# Patient Record
Sex: Female | Born: 1950 | Race: White | Hispanic: No | Marital: Married | Smoking: Never smoker
Health system: Southern US, Community
[De-identification: ages and names within clinical notes are randomized; demographics above are authoritative.]

---

## 1998-11-08 ENCOUNTER — Other Ambulatory Visit: Admission: RE | Admit: 1998-11-08 | Discharge: 1998-11-08 | Payer: Self-pay | Admitting: Obstetrics and Gynecology

## 1999-08-30 ENCOUNTER — Encounter (INDEPENDENT_AMBULATORY_CARE_PROVIDER_SITE_OTHER): Payer: Self-pay | Admitting: Specialist

## 1999-08-30 ENCOUNTER — Other Ambulatory Visit: Admission: RE | Admit: 1999-08-30 | Discharge: 1999-08-30 | Payer: Self-pay | Admitting: Internal Medicine

## 1999-09-11 ENCOUNTER — Ambulatory Visit (HOSPITAL_COMMUNITY): Admission: RE | Admit: 1999-09-11 | Discharge: 1999-09-11 | Payer: Self-pay | Admitting: Internal Medicine

## 1999-10-29 ENCOUNTER — Encounter: Payer: Self-pay | Admitting: Internal Medicine

## 1999-10-29 ENCOUNTER — Ambulatory Visit (HOSPITAL_COMMUNITY): Admission: RE | Admit: 1999-10-29 | Discharge: 1999-10-29 | Payer: Self-pay | Admitting: Internal Medicine

## 1999-11-20 ENCOUNTER — Other Ambulatory Visit: Admission: RE | Admit: 1999-11-20 | Discharge: 1999-11-20 | Payer: Self-pay | Admitting: Obstetrics and Gynecology

## 2001-12-30 ENCOUNTER — Other Ambulatory Visit: Admission: RE | Admit: 2001-12-30 | Discharge: 2001-12-30 | Payer: Self-pay | Admitting: Obstetrics & Gynecology

## 2003-04-09 ENCOUNTER — Emergency Department (HOSPITAL_COMMUNITY): Admission: EM | Admit: 2003-04-09 | Discharge: 2003-04-09 | Payer: Self-pay | Admitting: Emergency Medicine

## 2003-04-12 ENCOUNTER — Ambulatory Visit (HOSPITAL_COMMUNITY): Admission: RE | Admit: 2003-04-12 | Discharge: 2003-04-12 | Payer: Self-pay | Admitting: Emergency Medicine

## 2003-04-16 ENCOUNTER — Emergency Department (HOSPITAL_COMMUNITY): Admission: EM | Admit: 2003-04-16 | Discharge: 2003-04-16 | Payer: Self-pay | Admitting: Emergency Medicine

## 2003-04-23 ENCOUNTER — Emergency Department (HOSPITAL_COMMUNITY): Admission: EM | Admit: 2003-04-23 | Discharge: 2003-04-23 | Payer: Self-pay | Admitting: Emergency Medicine

## 2003-05-07 ENCOUNTER — Emergency Department (HOSPITAL_COMMUNITY): Admission: EM | Admit: 2003-05-07 | Discharge: 2003-05-07 | Payer: Self-pay | Admitting: Emergency Medicine

## 2004-08-03 ENCOUNTER — Ambulatory Visit: Payer: Self-pay | Admitting: Internal Medicine

## 2004-08-09 ENCOUNTER — Ambulatory Visit: Payer: Self-pay | Admitting: Internal Medicine

## 2004-09-24 ENCOUNTER — Ambulatory Visit: Payer: Self-pay | Admitting: Internal Medicine

## 2005-03-26 ENCOUNTER — Other Ambulatory Visit: Admission: RE | Admit: 2005-03-26 | Discharge: 2005-03-26 | Payer: Self-pay | Admitting: Obstetrics and Gynecology

## 2005-04-26 ENCOUNTER — Ambulatory Visit: Payer: Self-pay | Admitting: Internal Medicine

## 2005-05-08 ENCOUNTER — Ambulatory Visit: Payer: Self-pay | Admitting: Internal Medicine

## 2005-11-04 ENCOUNTER — Ambulatory Visit: Payer: Self-pay | Admitting: Internal Medicine

## 2006-01-20 ENCOUNTER — Ambulatory Visit: Payer: Self-pay | Admitting: Internal Medicine

## 2006-04-11 ENCOUNTER — Ambulatory Visit: Payer: Self-pay | Admitting: Internal Medicine

## 2006-04-11 LAB — CONVERTED CEMR LAB
ALT: 26 units/L (ref 0–40)
AST: 25 units/L (ref 0–37)
Albumin: 4.8 g/dL (ref 3.5–5.2)
Alkaline Phosphatase: 54 units/L (ref 39–117)
BUN: 8 mg/dL (ref 6–23)
Basophils Absolute: 0 10*3/uL (ref 0.0–0.1)
Basophils Relative: 0.3 % (ref 0.0–1.0)
CO2: 30 meq/L (ref 19–32)
Calcium: 9.7 mg/dL (ref 8.4–10.5)
Chloride: 104 meq/L (ref 96–112)
Chol/HDL Ratio, serum: 2
Cholesterol: 192 mg/dL (ref 0–200)
Creatinine, Ser: 0.7 mg/dL (ref 0.4–1.2)
Eosinophil percent: 1.3 % (ref 0.0–5.0)
GFR calc non Af Amer: 93 mL/min
Glomerular Filtration Rate, Af Am: 112 mL/min/{1.73_m2}
Glucose, Bld: 86 mg/dL (ref 70–99)
HCT: 39 % (ref 36.0–46.0)
HDL: 97.3 mg/dL (ref >39.0)
Hemoglobin: 13.2 g/dL (ref 12.0–15.0)
LDL Cholesterol: 86 mg/dL (ref 0–99)
Lymphocytes Relative: 20.7 % (ref 12.0–46.0)
MCHC: 33.9 g/dL (ref 30.0–36.0)
MCV: 97.5 fL (ref 78.0–100.0)
Monocytes Absolute: 0.4 10*3/uL (ref 0.2–0.7)
Monocytes Relative: 7.9 % (ref 3.0–11.0)
Neutro Abs: 3.5 10*3/uL (ref 1.4–7.7)
Neutrophils Relative %: 69.8 % (ref 43.0–77.0)
Platelets: 307 10*3/uL (ref 150–400)
Potassium: 4.2 meq/L (ref 3.5–5.1)
RBC: 4 M/uL (ref 3.87–5.11)
RDW: 13.8 % (ref 11.5–14.6)
Sodium: 143 meq/L (ref 135–145)
TSH: 1.26 microintl units/mL (ref 0.35–5.50)
Total Bilirubin: 0.9 mg/dL (ref 0.3–1.2)
Total Protein: 7.2 g/dL (ref 6.0–8.3)
Triglyceride fasting, serum: 45 mg/dL (ref 0–149)
VLDL: 9 mg/dL (ref 0–40)
WBC: 5 10*3/uL (ref 4.5–10.5)

## 2006-04-18 ENCOUNTER — Ambulatory Visit: Payer: Self-pay | Admitting: Internal Medicine

## 2006-09-04 ENCOUNTER — Ambulatory Visit: Payer: Self-pay | Admitting: Internal Medicine

## 2007-08-13 ENCOUNTER — Telehealth: Payer: Self-pay | Admitting: Internal Medicine

## 2007-08-14 ENCOUNTER — Ambulatory Visit: Payer: Self-pay | Admitting: Internal Medicine

## 2007-08-14 DIAGNOSIS — I10 Essential (primary) hypertension: Secondary | ICD-10-CM | POA: Insufficient documentation

## 2007-08-14 DIAGNOSIS — M1990 Unspecified osteoarthritis, unspecified site: Secondary | ICD-10-CM | POA: Insufficient documentation

## 2007-08-14 DIAGNOSIS — E785 Hyperlipidemia, unspecified: Secondary | ICD-10-CM | POA: Insufficient documentation

## 2007-09-04 ENCOUNTER — Ambulatory Visit: Payer: Self-pay | Admitting: Internal Medicine

## 2007-09-04 LAB — CONVERTED CEMR LAB
ALT: 34 units/L (ref 0–35)
AST: 26 units/L (ref 0–37)
Albumin: 4.5 g/dL (ref 3.5–5.2)
Alkaline Phosphatase: 60 units/L (ref 39–117)
BUN: 13 mg/dL (ref 6–23)
Basophils Absolute: 0 10*3/uL (ref 0.0–0.1)
Basophils Relative: 0.1 % (ref 0.0–1.0)
Bilirubin Urine: NEGATIVE
Bilirubin, Direct: 0.1 mg/dL (ref 0.0–0.3)
Blood in Urine, dipstick: NEGATIVE
CO2: 30 meq/L (ref 19–32)
Calcium: 9.7 mg/dL (ref 8.4–10.5)
Chloride: 105 meq/L (ref 96–112)
Cholesterol: 175 mg/dL (ref 0–200)
Creatinine, Ser: 0.7 mg/dL (ref 0.4–1.2)
Eosinophils Absolute: 0.1 10*3/uL (ref 0.0–0.7)
Eosinophils Relative: 2 % (ref 0.0–5.0)
GFR calc Af Amer: 112 mL/min
GFR calc non Af Amer: 92 mL/min
Glucose, Bld: 86 mg/dL (ref 70–99)
Glucose, Urine, Semiquant: NEGATIVE
HCT: 40.1 % (ref 36.0–46.0)
HDL: 82.5 mg/dL (ref >39.0)
Hemoglobin: 13.6 g/dL (ref 12.0–15.0)
Ketones, urine, test strip: NEGATIVE
LDL Cholesterol: 78 mg/dL (ref 0–99)
Lymphocytes Relative: 19.2 % (ref 12.0–46.0)
MCHC: 33.9 g/dL (ref 30.0–36.0)
MCV: 97.4 fL (ref 78.0–100.0)
Monocytes Absolute: 0.3 10*3/uL (ref 0.1–1.0)
Monocytes Relative: 5.8 % (ref 3.0–12.0)
Neutro Abs: 4 10*3/uL (ref 1.4–7.7)
Neutrophils Relative %: 72.9 % (ref 43.0–77.0)
Nitrite: NEGATIVE
Platelets: 272 10*3/uL (ref 150–400)
Potassium: 4.6 meq/L (ref 3.5–5.1)
Protein, U semiquant: NEGATIVE
RBC: 4.11 M/uL (ref 3.87–5.11)
RDW: 12.9 % (ref 11.5–14.6)
Sodium: 144 meq/L (ref 135–145)
Specific Gravity, Urine: <1.005
TSH: 1.37 microintl units/mL (ref 0.35–5.50)
Total Bilirubin: 0.7 mg/dL (ref 0.3–1.2)
Total CHOL/HDL Ratio: 2.1
Total Protein: 7.6 g/dL (ref 6.0–8.3)
Triglycerides: 73 mg/dL (ref 0–149)
Urobilinogen, UA: 0.2
VLDL: 15 mg/dL (ref 0–40)
WBC Urine, dipstick: NEGATIVE
WBC: 5.5 10*3/uL (ref 4.5–10.5)
pH: 5.5

## 2007-09-11 ENCOUNTER — Ambulatory Visit: Payer: Self-pay | Admitting: Internal Medicine

## 2007-09-16 ENCOUNTER — Encounter: Payer: Self-pay | Admitting: Internal Medicine

## 2007-09-16 ENCOUNTER — Ambulatory Visit: Payer: Self-pay | Admitting: Internal Medicine

## 2008-01-09 LAB — CONVERTED CEMR LAB: Pap Smear: NORMAL

## 2008-02-18 ENCOUNTER — Ambulatory Visit: Payer: Self-pay | Admitting: Internal Medicine

## 2008-10-26 ENCOUNTER — Ambulatory Visit: Payer: Self-pay | Admitting: Internal Medicine

## 2008-10-26 LAB — CONVERTED CEMR LAB
ALT: 43 units/L — ABNORMAL HIGH (ref 0–35)
AST: 37 units/L (ref 0–37)
Albumin: 4.6 g/dL (ref 3.5–5.2)
Alkaline Phosphatase: 57 units/L (ref 39–117)
BUN: 9 mg/dL (ref 6–23)
Basophils Absolute: 0 10*3/uL (ref 0.0–0.1)
Basophils Relative: 0.2 % (ref 0.0–3.0)
Bilirubin Urine: NEGATIVE
Bilirubin, Direct: 0 mg/dL (ref 0.0–0.3)
Blood in Urine, dipstick: NEGATIVE
CO2: 30 meq/L (ref 19–32)
Calcium: 9.4 mg/dL (ref 8.4–10.5)
Chloride: 97 meq/L (ref 96–112)
Cholesterol: 165 mg/dL (ref 0–200)
Creatinine, Ser: 0.6 mg/dL (ref 0.4–1.2)
Eosinophils Absolute: 0.1 10*3/uL (ref 0.0–0.7)
Eosinophils Relative: 1.5 % (ref 0.0–5.0)
GFR calc non Af Amer: 109.55 mL/min (ref >60)
Glucose, Bld: 94 mg/dL (ref 70–99)
Glucose, Urine, Semiquant: NEGATIVE
HCT: 37.5 % (ref 36.0–46.0)
HDL: 94.1 mg/dL (ref >39.00)
Hemoglobin: 13.1 g/dL (ref 12.0–15.0)
LDL Cholesterol: 60 mg/dL (ref 0–99)
Lymphocytes Relative: 23 % (ref 12.0–46.0)
Lymphs Abs: 1.1 10*3/uL (ref 0.7–4.0)
MCHC: 34.8 g/dL (ref 30.0–36.0)
MCV: 97.7 fL (ref 78.0–100.0)
Monocytes Absolute: 0.3 10*3/uL (ref 0.1–1.0)
Monocytes Relative: 7.2 % (ref 3.0–12.0)
Neutro Abs: 3.2 10*3/uL (ref 1.4–7.7)
Neutrophils Relative %: 68.1 % (ref 43.0–77.0)
Nitrite: NEGATIVE
Platelets: 255 10*3/uL (ref 150.0–400.0)
Potassium: 4.4 meq/L (ref 3.5–5.1)
Protein, U semiquant: NEGATIVE
RBC: 3.84 M/uL — ABNORMAL LOW (ref 3.87–5.11)
RDW: 12.7 % (ref 11.5–14.6)
Sodium: 141 meq/L (ref 135–145)
Specific Gravity, Urine: 1.01
TSH: 1.09 microintl units/mL (ref 0.35–5.50)
Total Bilirubin: 0.9 mg/dL (ref 0.3–1.2)
Total CHOL/HDL Ratio: 2
Total Protein: 7.9 g/dL (ref 6.0–8.3)
Triglycerides: 57 mg/dL (ref 0.0–149.0)
Urobilinogen, UA: 0.2
VLDL: 11.4 mg/dL (ref 0.0–40.0)
WBC: 4.7 10*3/uL (ref 4.5–10.5)
pH: 7

## 2008-11-02 ENCOUNTER — Ambulatory Visit: Payer: Self-pay | Admitting: Internal Medicine

## 2008-11-07 ENCOUNTER — Ambulatory Visit: Payer: Self-pay | Admitting: Internal Medicine

## 2008-11-07 DIAGNOSIS — S99919A Unspecified injury of unspecified ankle, initial encounter: Secondary | ICD-10-CM

## 2008-11-07 DIAGNOSIS — S99929A Unspecified injury of unspecified foot, initial encounter: Secondary | ICD-10-CM | POA: Insufficient documentation

## 2008-11-07 DIAGNOSIS — S8990XA Unspecified injury of unspecified lower leg, initial encounter: Secondary | ICD-10-CM

## 2008-11-16 ENCOUNTER — Ambulatory Visit: Payer: Self-pay | Admitting: Internal Medicine

## 2008-12-20 ENCOUNTER — Ambulatory Visit: Payer: Self-pay | Admitting: Internal Medicine

## 2009-01-20 ENCOUNTER — Ambulatory Visit: Payer: Self-pay | Admitting: Internal Medicine

## 2009-02-16 ENCOUNTER — Ambulatory Visit: Payer: Self-pay | Admitting: Internal Medicine

## 2009-12-14 ENCOUNTER — Ambulatory Visit: Payer: Self-pay | Admitting: Family Medicine

## 2009-12-14 ENCOUNTER — Encounter: Payer: Self-pay | Admitting: Internal Medicine

## 2010-01-19 ENCOUNTER — Ambulatory Visit: Payer: Self-pay | Admitting: Internal Medicine

## 2010-01-19 LAB — CONVERTED CEMR LAB
ALT: 34 units/L (ref 0–35)
AST: 35 units/L (ref 0–37)
Albumin: 4.2 g/dL (ref 3.5–5.2)
Alkaline Phosphatase: 63 units/L (ref 39–117)
BUN: 16 mg/dL (ref 6–23)
Basophils Absolute: 0.1 10*3/uL (ref 0.0–0.1)
Basophils Relative: 1.4 % (ref 0.0–3.0)
Bilirubin Urine: NEGATIVE
Bilirubin, Direct: 0.1 mg/dL (ref 0.0–0.3)
CO2: 29 meq/L (ref 19–32)
Calcium: 8.9 mg/dL (ref 8.4–10.5)
Chloride: 105 meq/L (ref 96–112)
Cholesterol: 209 mg/dL — ABNORMAL HIGH (ref 0–200)
Creatinine, Ser: 0.5 mg/dL (ref 0.4–1.2)
Direct LDL: 95.7 mg/dL
Eosinophils Absolute: 0.1 10*3/uL (ref 0.0–0.7)
Eosinophils Relative: 2.5 % (ref 0.0–5.0)
GFR calc non Af Amer: 134.61 mL/min (ref >60)
Glucose, Bld: 74 mg/dL (ref 70–99)
HCT: 35.5 % — ABNORMAL LOW (ref 36.0–46.0)
HDL: 95 mg/dL (ref >39.00)
Hemoglobin, Urine: NEGATIVE
Hemoglobin: 12.5 g/dL (ref 12.0–15.0)
Ketones, ur: NEGATIVE mg/dL
Lymphocytes Relative: 33.6 % (ref 12.0–46.0)
Lymphs Abs: 1.5 10*3/uL (ref 0.7–4.0)
MCHC: 35.3 g/dL (ref 30.0–36.0)
MCV: 98 fL (ref 78.0–100.0)
Monocytes Absolute: 0.4 10*3/uL (ref 0.1–1.0)
Monocytes Relative: 8 % (ref 3.0–12.0)
Neutro Abs: 2.4 10*3/uL (ref 1.4–7.7)
Neutrophils Relative %: 54.5 % (ref 43.0–77.0)
Nitrite: NEGATIVE
Platelets: 227 10*3/uL (ref 150.0–400.0)
Potassium: 4.7 meq/L (ref 3.5–5.1)
RBC: 3.62 M/uL — ABNORMAL LOW (ref 3.87–5.11)
RDW: 14.9 % — ABNORMAL HIGH (ref 11.5–14.6)
Sodium: 141 meq/L (ref 135–145)
Specific Gravity, Urine: 1.015 (ref 1.000–1.030)
TSH: 1.2 microintl units/mL (ref 0.35–5.50)
Total Bilirubin: 0.6 mg/dL (ref 0.3–1.2)
Total CHOL/HDL Ratio: 2
Total Protein, Urine: NEGATIVE mg/dL
Total Protein: 6.8 g/dL (ref 6.0–8.3)
Triglycerides: 132 mg/dL (ref 0.0–149.0)
Urine Glucose: NEGATIVE mg/dL
Urobilinogen, UA: 0.2 (ref 0.0–1.0)
VLDL: 26.4 mg/dL (ref 0.0–40.0)
WBC: 4.4 10*3/uL — ABNORMAL LOW (ref 4.5–10.5)
pH: 7 (ref 5.0–8.0)

## 2010-01-29 ENCOUNTER — Ambulatory Visit: Payer: Self-pay | Admitting: Internal Medicine

## 2010-06-12 NOTE — Miscellaneous (Signed)
Summary: BONE DENSITY  Clinical Lists Changes  Orders: Added new Test order of T-Bone Densitometry (77080) - Signed Added new Test order of T-Lumbar Vertebral Assessment (77082) - Signed 

## 2010-06-12 NOTE — Assessment & Plan Note (Signed)
Summary: cpx/no pap/njr   Vital Signs:  Patient profile:   60 year old female Height:      62 inches Weight:      150 pounds BMI:     27.53 Temp:     98.2 degrees F oral Pulse rate:   88 / minute Resp:     14 per minute BP sitting:   150 / 70  (left arm)  Vitals Entered By: Willy Eddy, LPN (January 29, 2010 3:18 PM) CC: cpx Is Patient Diabetic? No   CC:  cpx.  History of Present Illness: The pt was asked about all immunizations, health maint. services that are appropriate to their age and was given guidance on diet exercize  and weight management  fatigue with multiple contriutors: insomnia, mild anemia, awakenings new finding mild anemia with fatigue increased palntar faciatis episodes    Preventive Screening-Counseling & Management  Alcohol-Tobacco     Smoking Status: never     Passive Smoke Exposure: no     Tobacco Counseling: not indicated; no tobacco use  Problems Prior to Update: 1)  Foot Injury, Right  (ICD-959.7) 2)  Preventive Health Care  (ICD-V70.0) 3)  Osteoarthritis  (ICD-715.90) 4)  Hypertension  (ICD-401.9) 5)  Hyperlipidemia  (ICD-272.4)  Current Problems (verified): 1)  Foot Injury, Right  (ICD-959.7) 2)  Preventive Health Care  (ICD-V70.0) 3)  Osteoarthritis  (ICD-715.90) 4)  Hypertension  (ICD-401.9) 5)  Hyperlipidemia  (ICD-272.4)  Medications Prior to Update: 1)  Lipitor 10 Mg  Tabs (Atorvastatin Calcium) .... Qd 2)  Benicar 20 Mg  Tabs (Olmesartan Medoxomil) .... One By Mouth Daily 3)  Fosamax 70 Mg  Tabs (Alendronate Sodium) .Marland Kitchen.. 1 Every Week 4)  Doxycycline Hyclate 100 Mg  Caps (Doxycycline Hyclate) .Marland Kitchen.. 1 Once Daily As Needed Rosacea Flare 5)  Ambien Cr 12.5 Mg  Tbcr (Zolpidem Tartrate) .Marland Kitchen.. 1 At Bedtime As Needed 6)  Lotrisone 1-0.05 % Lotn (Clotrimazole-Betamethasone) .... 5 Drop in Affected Ear Once Daily  Current Medications (verified): 1)  Lipitor 10 Mg  Tabs (Atorvastatin Calcium) .... Qd 2)  Benicar 20 Mg  Tabs  (Olmesartan Medoxomil) .... One By Mouth Daily 3)  Fosamax 70 Mg  Tabs (Alendronate Sodium) .Marland Kitchen.. 1 Every Week 4)  Doxycycline Hyclate 100 Mg  Caps (Doxycycline Hyclate) .Marland Kitchen.. 1 Once Daily As Needed Rosacea Flare 5)  Lotrisone 1-0.05 % Lotn (Clotrimazole-Betamethasone) .... 5 Drop in Affected Ear Once Daily 6)  Folbee 2.5-25-1 Mg Tabs (Folic Acid-Vit B6-Vit B12) .... One By Mouth Daily 7)  Zolpidem Tartrate 6.25 Mg Cr-Tabs (Zolpidem Tartrate) .... One By Mouth Q Hs Prn  Allergies (verified): 1)  ! Demerol  Past History:  Family History: Last updated: 08/14/2007 mother  57 d/c Family History Hypertension father 11  d/c Family History of Prostate CA 1st degree relative <50  Social History: Last updated: 08/14/2007 Married Never Smoked Alcohol use-yes Drug use-no Regular exercise-no  Risk Factors: Exercise: no (08/14/2007)  Risk Factors: Smoking Status: never (01/29/2010) Passive Smoke Exposure: no (01/29/2010)  Past medical, surgical, family and social histories (including risk factors) reviewed, and no changes noted (except as noted below).  Past Medical History: Reviewed history from 08/14/2007 and no changes required. Hyperlipidemia Hypertension Osteoarthritis  Past Surgical History: Reviewed history from 08/14/2007 and no changes required. Caesarean section Hysterectomy  Family History: Reviewed history from 08/14/2007 and no changes required. mother  38 d/c Family History Hypertension father 42  d/c Family History of Prostate CA 1st degree relative <50  Social  History: Reviewed history from 08/14/2007 and no changes required. Married Never Smoked Alcohol use-yes Drug use-no Regular exercise-no  Review of Systems  The patient denies anorexia, fever, weight loss, weight gain, vision loss, decreased hearing, hoarseness, chest pain, syncope, dyspnea on exertion, peripheral edema, prolonged cough, headaches, hemoptysis, abdominal pain, melena, hematochezia,  severe indigestion/heartburn, hematuria, incontinence, genital sores, muscle weakness, suspicious skin lesions, transient blindness, difficulty walking, depression, unusual weight change, abnormal bleeding, enlarged lymph nodes, angioedema, and breast masses.    Contraindications/Deferment of Procedures/Staging:    Test/Procedure: FLU VAX    Reason for deferment: patient declined   Physical Exam  General:  Well-developed,well-nourished,in no acute distress; alert,appropriate and cooperative throughout examinationoverweight-appearing.   Head:  Normocephalic and atraumatic without obvious abnormalities. No apparent alopecia or balding. Eyes:  No corneal or conjunctival inflammation noted. EOMI. Perrla. Funduscopic exam benign, without hemorrhages, exudates or papilledema. Vision grossly normal. Nose:  External nasal examination shows no deformity or inflammation. Nasal mucosa are pink and moist without lesions or exudates. Neck:  No deformities, masses, or tenderness noted. Lungs:  Normal respiratory effort, chest expands symmetrically. Lungs are clear to auscultation, no crackles or wheezes. Heart:  Normal rate and regular rhythm. S1 and S2 normal without gallop, murmur, click, rub or other extra sounds. Abdomen:  no bruitssoft, non-tender, and normal bowel sounds.   Msk:  normal ROM and no joint tenderness.   Pulses:  R and L carotid,radial,femoral,dorsalis pedis and posterior tibial pulses are full and equal bilaterally Extremities:  No clubbing, cyanosis, edema, or deformity noted with normal full range of motion of all joints.   Neurologic:  No cranial nerve deficits noted. Station and gait are normal. Plantar reflexes are down-going bilaterally. DTRs are symmetrical throughout. Sensory, motor and coordinative functions appear intact.   Impression & Recommendations:  Problem # 1:  PREVENTIVE HEALTH CARE (ICD-V70.0) Assessment Unchanged The pt was asked about all immunizations, health  maint. services that are appropriate to their age and was given guidance on diet exercize  and weight management weight loss primary goal Mammogram: normal (01/09/2008) Pap smear: normal (01/09/2008) Colonoscopy: Location:  [facility].   (01/20/2009) Td Booster: Td (09/11/2002)   Flu Vax: Fluvax 3+ (02/16/2009)   Chol: 209 (01/19/2010)   HDL: 95.00 (01/19/2010)   LDL: 60 (10/26/2008)   TG: 132.0 (01/19/2010) TSH: 1.20 (01/19/2010)   Next mammogram due:: 01/2009 (11/02/2008) Next Colonoscopy due:: 01/2019 (01/20/2009)  Discussed using sunscreen, use of alcohol, drug use, self breast exam, routine dental care, routine eye care, schedule for GYN exam, routine physical exam, seat belts, multiple vitamins, osteoporosis prevention, adequate calcium intake in diet, recommendations for immunizations, mammograms and Pap smears.  Discussed exercise and checking cholesterol.  Discussed gun safety, safe sex, and contraception.  Problem # 2:  HYPERTENSION (ICD-401.9) Assessment: Unchanged weight loss Her updated medication list for this problem includes:    Benicar 20 Mg Tabs (Olmesartan medoxomil) ..... One by mouth daily  Orders: EKG w/ Interpretation (93000)  BP today: 150/70 Prior BP: 136/80 (11/02/2008)  Prior 10 Yr Risk Heart Disease: 4 % (09/11/2007)  Labs Reviewed: K+: 4.7 (01/19/2010) Creat: : 0.5 (01/19/2010)   Chol: 209 (01/19/2010)   HDL: 95.00 (01/19/2010)   LDL: 60 (10/26/2008)   TG: 132.0 (01/19/2010)  Problem # 3:  FOOT INJURY, RIGHT (ICD-959.7) stretching  Problem # 4:  HYPERLIPIDEMIA (ICD-272.4) stalble Her updated medication list for this problem includes:    Lipitor 10 Mg Tabs (Atorvastatin calcium) ..... Qd  Labs Reviewed: SGOT: 35 (  01/19/2010)   SGPT: 34 (01/19/2010)  Prior 10 Yr Risk Heart Disease: 4 % (09/11/2007)   HDL:95.00 (01/19/2010), 94.10 (10/26/2008)  LDL:60 (10/26/2008), 78 (09/04/2007)  Chol:209 (01/19/2010), 165 (10/26/2008)  Trig:132.0  (01/19/2010), 57.0 (10/26/2008)  Complete Medication List: 1)  Lipitor 10 Mg Tabs (Atorvastatin calcium) .... Qd 2)  Benicar 20 Mg Tabs (Olmesartan medoxomil) .... One by mouth daily 3)  Fosamax 70 Mg Tabs (Alendronate sodium) .Marland Kitchen.. 1 every week 4)  Doxycycline Hyclate 100 Mg Caps (Doxycycline hyclate) .Marland Kitchen.. 1 once daily as needed rosacea flare 5)  Lotrisone 1-0.05 % Lotn (Clotrimazole-betamethasone) .... 5 drop in affected ear once daily 6)  Folbee 2.5-25-1 Mg Tabs (Folic acid-vit b6-vit b12) .... One by mouth daily 7)  Zolpidem Tartrate 6.25 Mg Cr-tabs (Zolpidem tartrate) .... One by mouth q hs prn  Patient Instructions: 1)  [email or web address] for weight loss 2)  Please schedule a follow-up appointment in 6 months. Prescriptions: ZOLPIDEM TARTRATE 6.25 MG CR-TABS (ZOLPIDEM TARTRATE) one by mouth q HS prn  #30 x 3   Entered and Authorized by:   Stacie Glaze MD   Signed by:   Stacie Glaze MD on 01/29/2010   Method used:   Print then Give to Patient   RxID:   [id number] FOLBEE 2.5-25-1 MG TABS (FOLIC ACID-VIT B6-VIT B12) one by mouth daily  #30 x 11   Entered and Authorized by:   Stacie Glaze MD   Signed by:   Stacie Glaze MD on 01/29/2010   Method used:   Electronically to        [facility] [address]* (retail)       [address]       [place], [place]  [address]       Ph: [phone]       Fax: [phone]   RxID:   [id number]

## 2010-09-28 NOTE — Procedures (Signed)
[facility]  Patient:    Tina Downs, Tina Downs                         MRN: [id number] Proc. Date: 09/19/99 Adm. Date:  [id number] Disc. Date: [id number] Attending:  Mervin Hack CC:         Stacie Glaze, M.D.                           Procedure Report  PROCEDURE:  Esophageal manometry.  ENDOSCOPIST:  Hedwig Morton. Juanda Chance, M.D.  INDICATIONS:  This is a 60 year old white female who has a history of gastroesophageal reflux disease.  She has had regurgitation of food, as well as acid, and has been treated for acid reflux with proton pump inhibitor.  She is complaining of choking sensation, and it was not clear whether this may not represent esophageal motility disorder.  Upper endoscopy on August 30, 1999, showed normal esophagus, stomach, and duodenum.  RESULTS:  MonoLith catheter passed blindly from the nares through the lower esophageal sphincter.  It was ________ through the lower esophageal sphincter which had a 3.5 cm intra-abdominal link.  Lower esophageal sphincter pressure was 17.7 mmHg, normal being 10-45 mmHg.  This represents a normal lower esophageal sphincter pressure.  It relaxed to the baseline 94% of the time which is normal.  Peristaltic waves were measured throughout the esophagus.  Ten wet swallows were executed.  In the proximal, mid, and distal esophagus, the amplitude and duration of the waves were essentially normal with slight increase in amplitude in the distal esophagus, 193 mmHg with normal being 30-180 mmHg. Out of 10 wet swallows, eight of then were _________ peristaltic contractions.  One of them was simultaneous and one retrograde contractions. There were 80% peristaltic contractions, 10% simultaneous, and 10% retrograde.  In the upper esophageal sphincter, the residual pressure was 1.8 mm which represents 99% relaxation.  IMPRESSION:  This is an essentially normal esophageal manometry with a minor nonspecific  abnormalities of the peristalsis which does not _____ any specific diagnostic pattern and does not deviate from normal.  PLAN:  The patient will continue on the acid suppressing agents and may be tried on medications for esophageal spasm such as antispasmodics, minor tranquilizer, or sedatives. DD:  09/19/99 TD:  09/20/99 Job: [id number]

## 2010-11-23 ENCOUNTER — Other Ambulatory Visit: Payer: Self-pay | Admitting: Internal Medicine

## 2011-01-28 ENCOUNTER — Other Ambulatory Visit (INDEPENDENT_AMBULATORY_CARE_PROVIDER_SITE_OTHER): Payer: 59

## 2011-01-28 DIAGNOSIS — Z0000 Encounter for general adult medical examination without abnormal findings: Secondary | ICD-10-CM

## 2011-01-28 LAB — CBC WITH DIFFERENTIAL/PLATELET
Basophils Relative: 0.7 % (ref 0.0–3.0)
Eosinophils Relative: 2 % (ref 0.0–5.0)
Lymphocytes Relative: 18.4 % (ref 12.0–46.0)
MCV: 98.6 fl (ref 78.0–100.0)
Monocytes Absolute: 0.5 10*3/uL (ref 0.1–1.0)
Monocytes Relative: 7.6 % (ref 3.0–12.0)
Neutrophils Relative %: 71.3 % (ref 43.0–77.0)
Platelets: 261 10*3/uL (ref 150.0–400.0)
RBC: 3.87 Mil/uL (ref 3.87–5.11)
WBC: 6 10*3/uL (ref 4.5–10.5)

## 2011-01-28 LAB — POCT URINALYSIS DIPSTICK
Blood, UA: NEGATIVE
Ketones, UA: NEGATIVE
Protein, UA: NEGATIVE
Spec Grav, UA: 1.015
pH, UA: 7

## 2011-01-28 LAB — LIPID PANEL
HDL: 78.5 mg/dL (ref >39.00)
Triglycerides: 108 mg/dL (ref 0.0–149.0)
VLDL: 21.6 mg/dL (ref 0.0–40.0)

## 2011-01-28 LAB — BASIC METABOLIC PANEL
BUN: 17 mg/dL (ref 6–23)
Chloride: 102 mEq/L (ref 96–112)
Creatinine, Ser: 0.6 mg/dL (ref 0.4–1.2)
GFR: 108.69 mL/min (ref >60.00)

## 2011-01-28 LAB — HEPATIC FUNCTION PANEL
Albumin: 4.6 g/dL (ref 3.5–5.2)
Total Protein: 7.4 g/dL (ref 6.0–8.3)

## 2011-01-29 IMAGING — CR DG FOOT COMPLETE 3+V*R*
3 series · 3 of 3 positions shown · non-contrast
Comparison: None

CLINICAL DATA: Injured right foot today with pain at fourth and
fifth toes

RIGHT FOOT COMPLETE - 3+ VIEW

[view not recorded (1 of 3)]
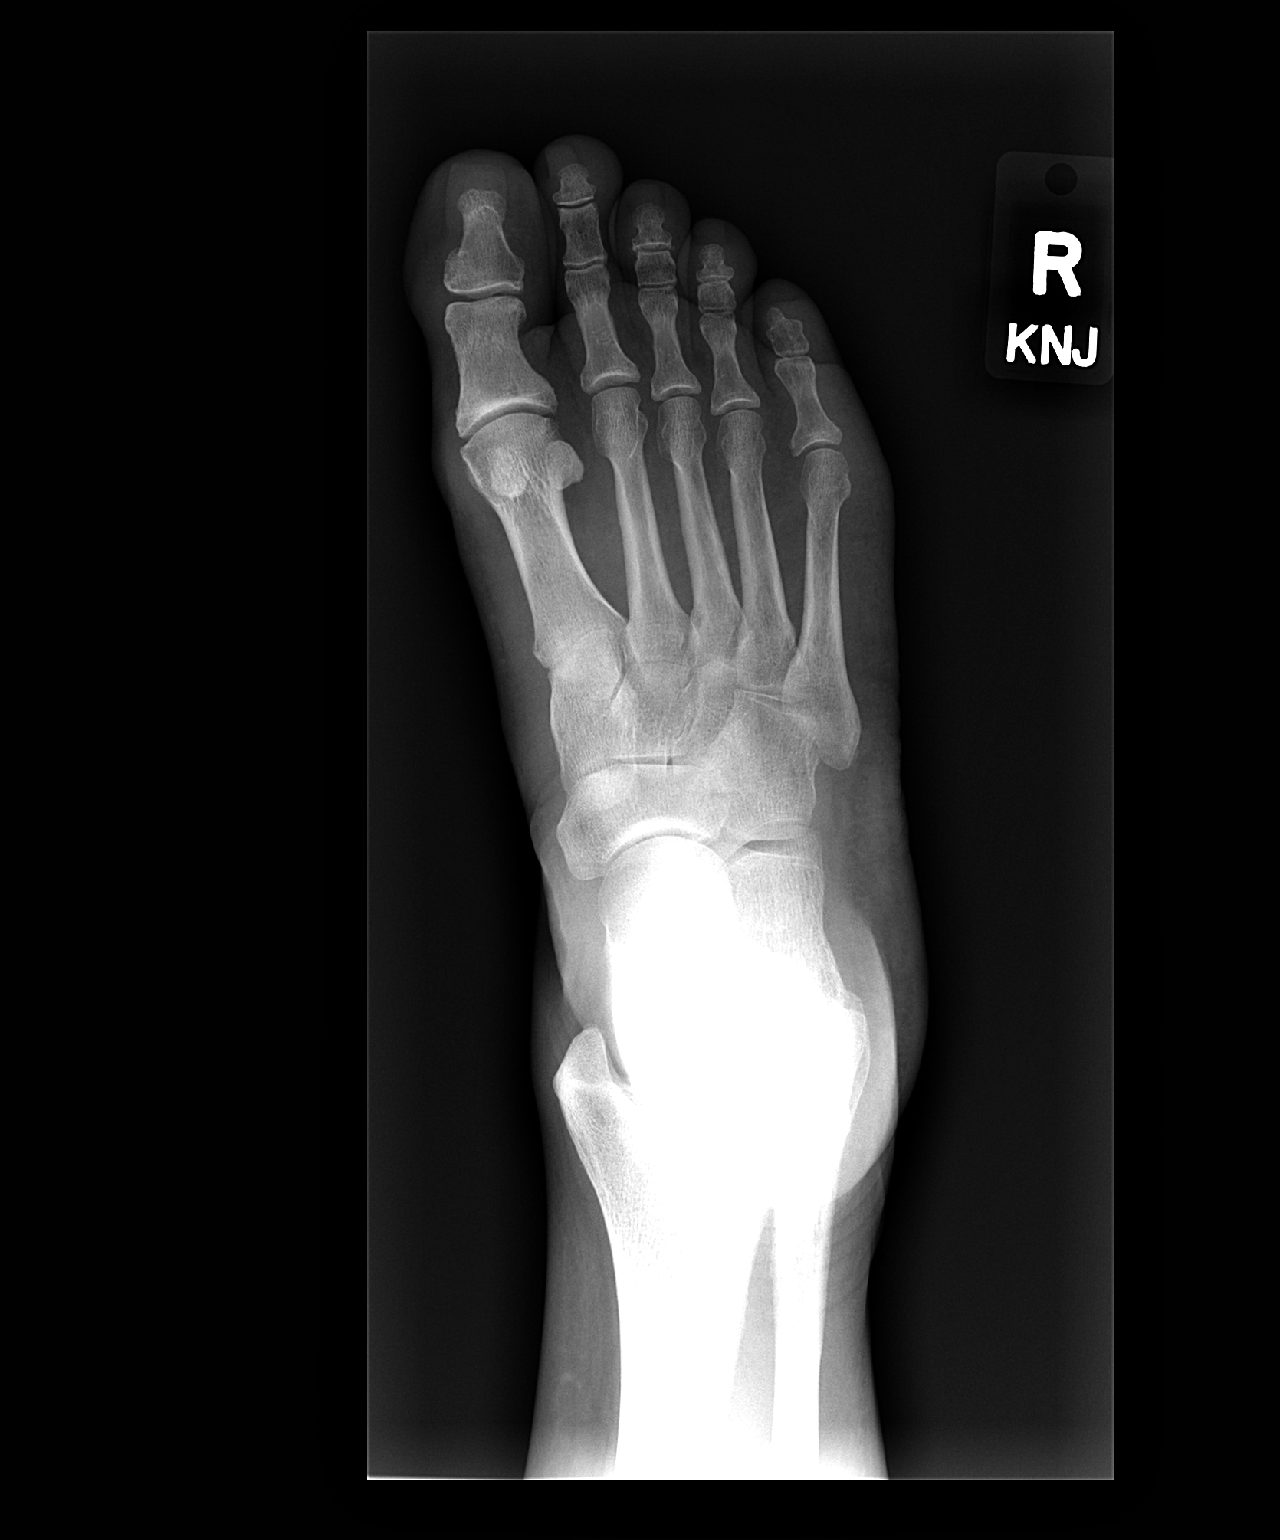

[view not recorded (2 of 3)]
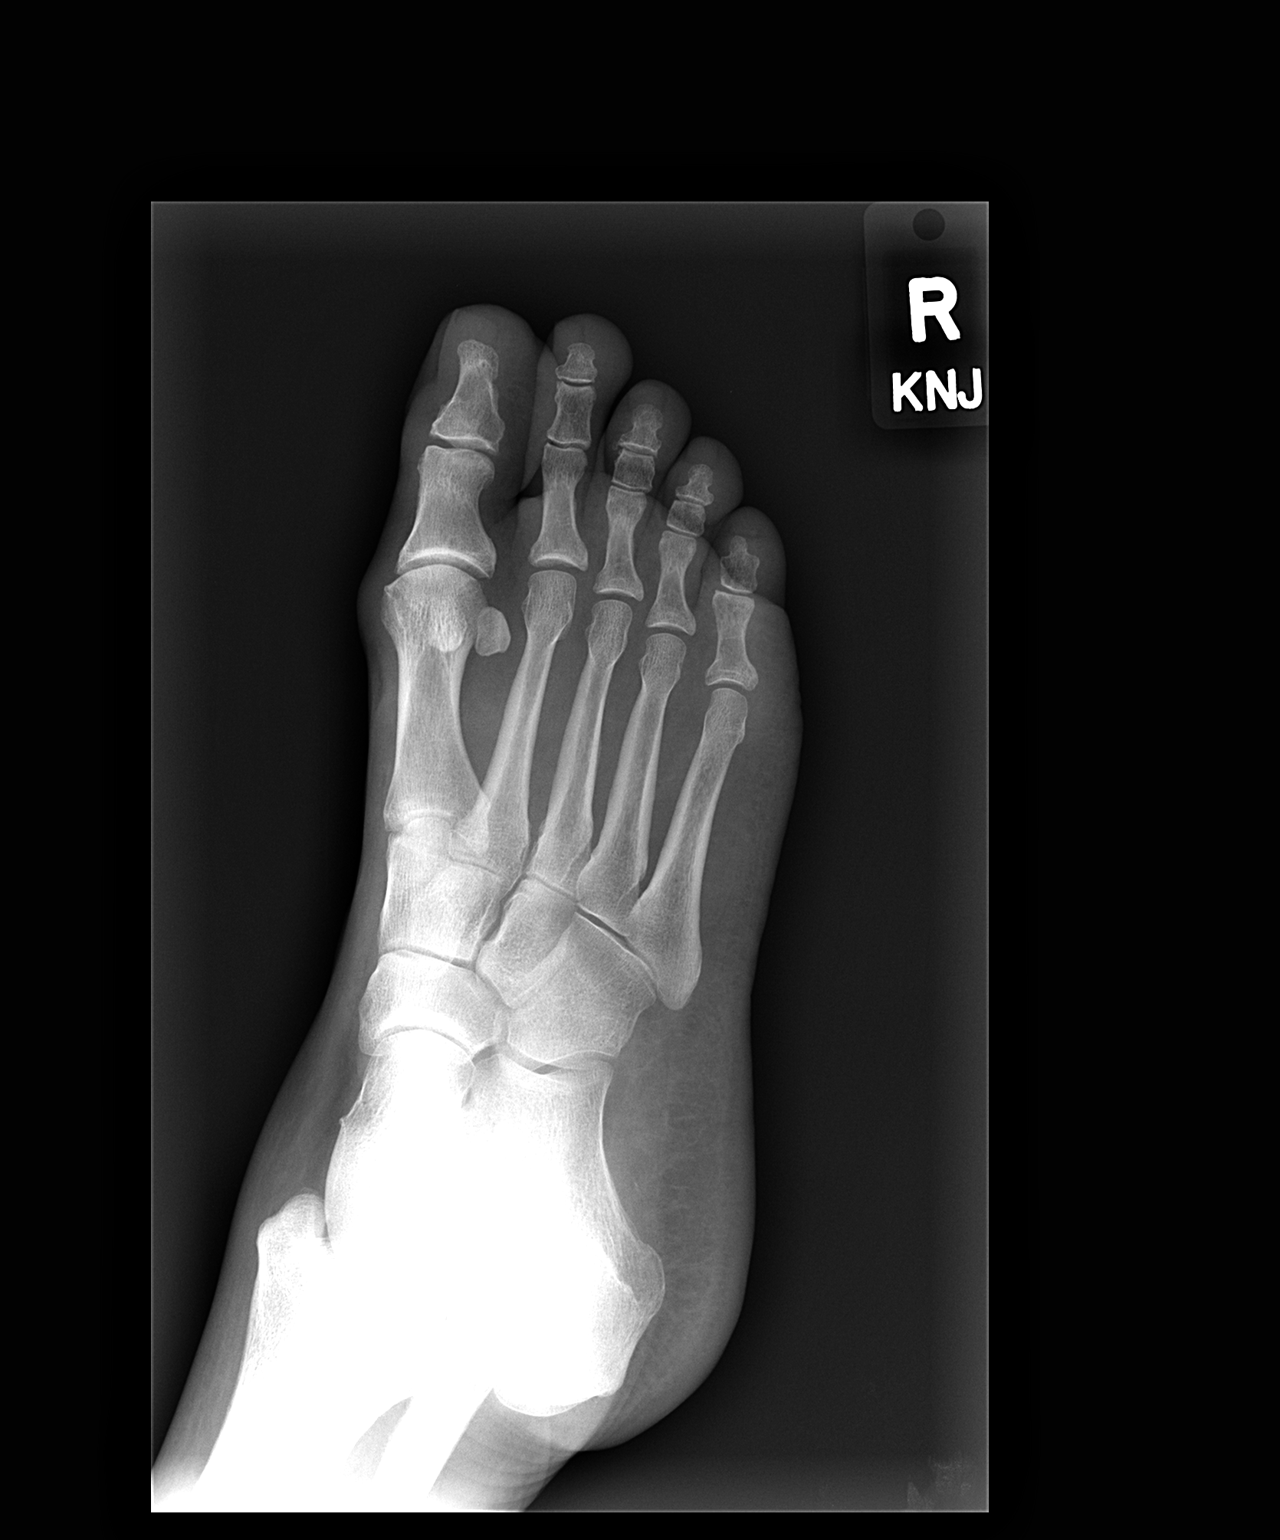

[view not recorded (3 of 3)]
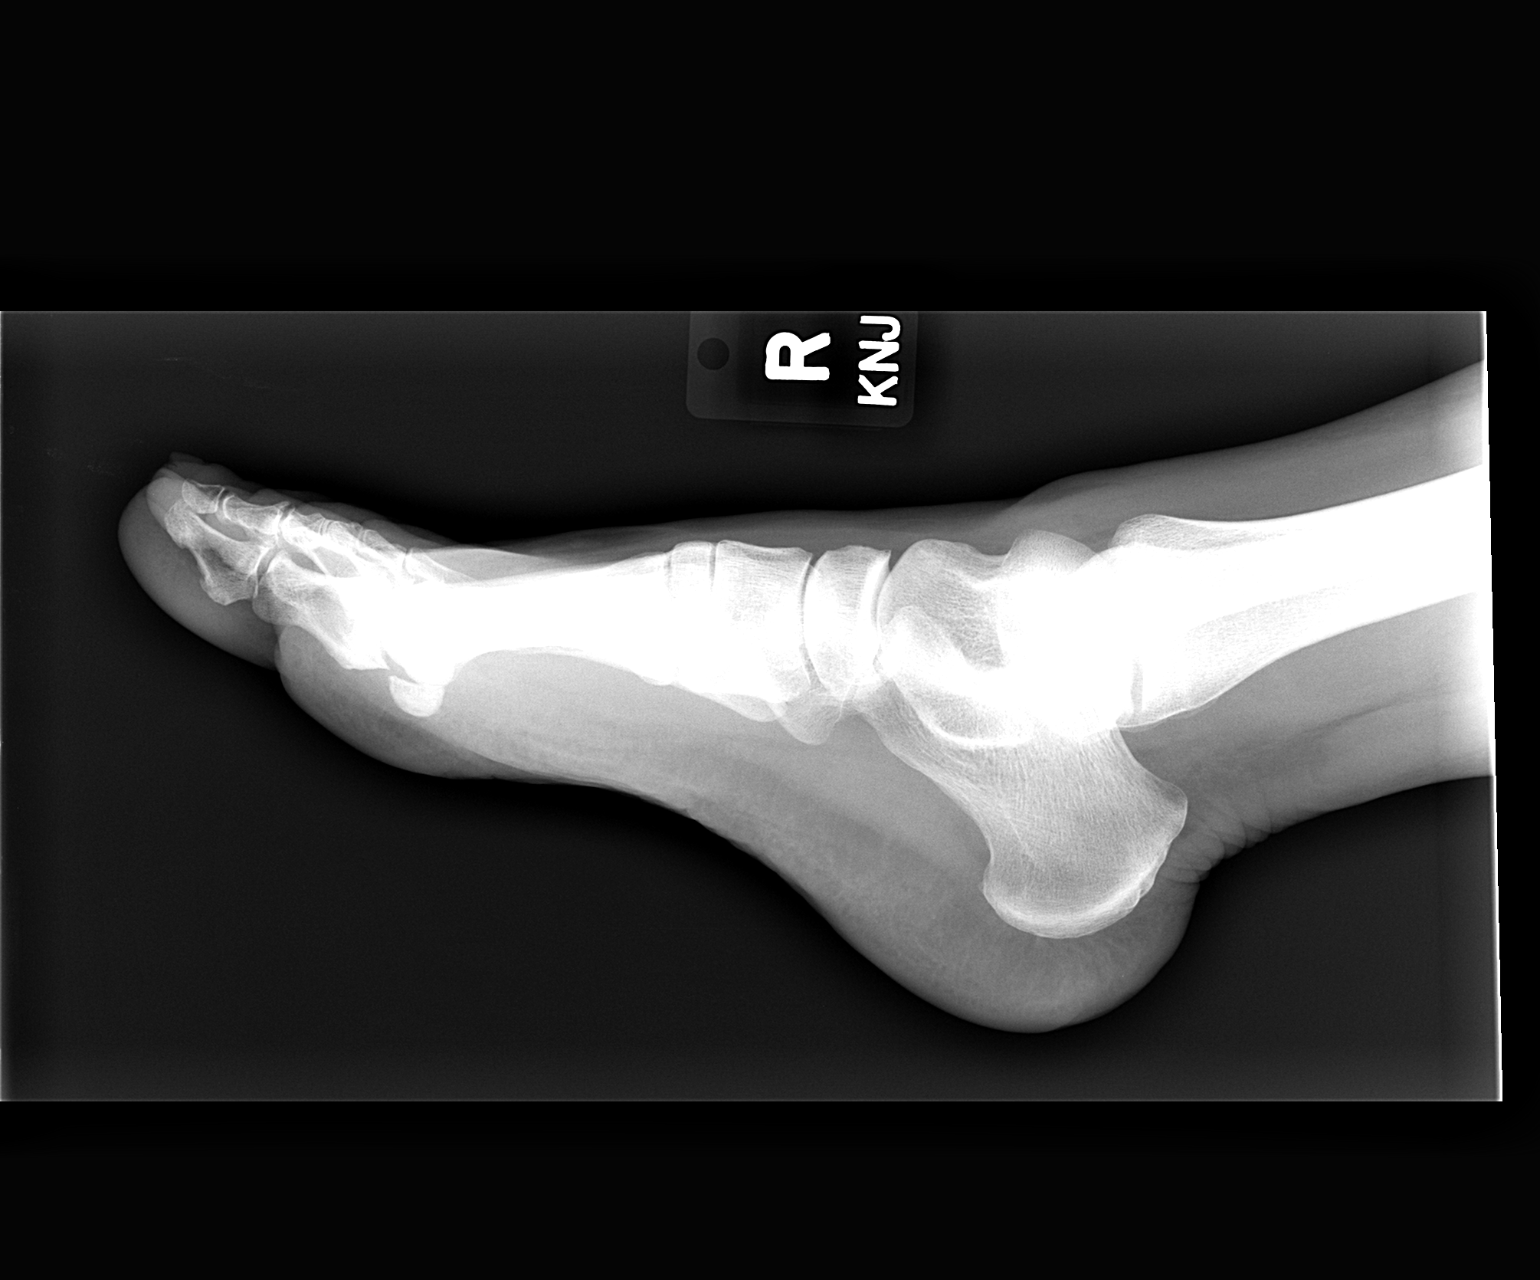

[3 of 3 positions shown; findings below may reference images not displayed]

FINDINGS: No acute fracture is seen.  Alignment is normal.  No
erosion is noted
IMPRESSION: Negative right foot.

## 2011-02-04 ENCOUNTER — Encounter: Payer: Self-pay | Admitting: Internal Medicine

## 2011-02-04 ENCOUNTER — Ambulatory Visit (INDEPENDENT_AMBULATORY_CARE_PROVIDER_SITE_OTHER): Payer: 59 | Admitting: Internal Medicine

## 2011-02-04 ENCOUNTER — Ambulatory Visit: Payer: Self-pay | Admitting: Internal Medicine

## 2011-02-04 VITALS — BP 140/76 | HR 72 | Temp 98.2°F | Resp 16 | Ht 60.0 in | Wt 158.0 lb

## 2011-02-04 DIAGNOSIS — Z0000 Encounter for general adult medical examination without abnormal findings: Secondary | ICD-10-CM

## 2011-02-04 NOTE — Patient Instructions (Addendum)
Look over-the-counter for something called black cohosh one of the brands that I prefer is Remifemin  Melatonin 3 mg at bed time

## 2011-02-06 NOTE — Progress Notes (Signed)
  Subjective:    Patient ID: Tina Downs, female    DOB: 1950/09/13, 60 y.o.   MRN: [id number]  HPI  Patient presents for annual physical examination.  Her problem list includes hyperlipidemia hypertension and osteoarthritis. Her current medications include Benicar 20 Lipitor 10. Screening blood work prior to the office visit which was reviewed.      Review of Systems  Constitutional: Negative for activity change, appetite change and fatigue.  HENT: Negative for ear pain, congestion, neck pain, postnasal drip and sinus pressure.   Eyes: Negative for redness and visual disturbance.  Respiratory: Negative for cough, shortness of breath and wheezing.   Gastrointestinal: Negative for abdominal pain and abdominal distention.  Genitourinary: Negative for dysuria, frequency and menstrual problem.  Musculoskeletal: Negative for myalgias, joint swelling and arthralgias.  Skin: Negative for rash and wound.  Neurological: Negative for dizziness, weakness and headaches.  Hematological: Negative for adenopathy. Does not bruise/bleed easily.  Psychiatric/Behavioral: Negative for sleep disturbance and decreased concentration.   No past medical history on file. No past surgical history on file.  reports that she has never smoked. She has never used smokeless tobacco. She reports that she drinks about 2 ounces of alcohol per week. Her drug history not on file. family history is not on file. Allergies  Allergen Reactions  . Meperidine Hcl     REACTION: nausea/vomiting       Objective:   Physical Exam  Constitutional: She is oriented to person, place, and time. She appears well-developed and well-nourished. No distress.  HENT:  Head: Normocephalic and atraumatic.  Right Ear: External ear normal.  Left Ear: External ear normal.  Nose: Nose normal.  Mouth/Throat: Oropharynx is clear and moist.  Eyes: Conjunctivae and EOM are normal. Pupils are equal, round, and reactive to light.  Neck:  Normal range of motion. Neck supple. No JVD present. No tracheal deviation present. No thyromegaly present.  Cardiovascular: Normal rate, regular rhythm, normal heart sounds and intact distal pulses.   No murmur heard. Pulmonary/Chest: Effort normal and breath sounds normal. She has no wheezes. She exhibits no tenderness.  Abdominal: Soft. Bowel sounds are normal.  Musculoskeletal: Normal range of motion. She exhibits no edema and no tenderness.  Lymphadenopathy:    She has no cervical adenopathy.  Neurological: She is alert and oriented to person, place, and time. She has normal reflexes. No cranial nerve deficit.  Skin: Skin is warm and dry. She is not diaphoretic.  Psychiatric: She has a normal mood and affect. Her behavior is normal.          Assessment & Plan:   Patient presents for yearly preventative medicine examination.   all immunizations and health maintenance protocols were reviewed with the patient and they are up to date with these protocols.   screening laboratory values were reviewed with the patient including screening of hyperlipidemia PSA renal function and hepatic function.   There medications past medical history social history problem list and allergies were reviewed in detail.   Goals were established with regard to weight loss exercise diet in compliance with medications

## 2011-09-15 ENCOUNTER — Other Ambulatory Visit: Payer: Self-pay | Admitting: Internal Medicine

## 2012-04-13 ENCOUNTER — Encounter: Payer: Self-pay | Admitting: Family Medicine

## 2012-04-13 ENCOUNTER — Ambulatory Visit (INDEPENDENT_AMBULATORY_CARE_PROVIDER_SITE_OTHER): Payer: 59 | Admitting: Family Medicine

## 2012-04-13 VITALS — BP 140/88 | HR 87 | Temp 98.5°F | Wt 176.0 lb

## 2012-04-13 DIAGNOSIS — R0982 Postnasal drip: Secondary | ICD-10-CM

## 2012-04-13 DIAGNOSIS — R05 Cough: Secondary | ICD-10-CM

## 2012-04-13 MED ORDER — FLUTICASONE PROPIONATE 50 MCG/ACT NA SUSP: 2.0000 | Freq: Every day | NASAL | Status: AC

## 2012-04-13 MED ORDER — ALBUTEROL SULFATE HFA 108 (90 BASE) MCG/ACT IN AERS: 2.0000 | INHALATION_SPRAY | Freq: Four times a day (QID) | RESPIRATORY_TRACT | Status: AC | PRN

## 2012-04-13 NOTE — Patient Instructions (Addendum)
Flonase as instructed, complete antibiotic  -gentle stretching and activity daily  -heat 15 minutes twice daily  -ibuprofen or tylenol as instructed  -follow up with your doctor if continued cough in 2 weeks or if worsening or continued pain in 3-4 weeks

## 2012-04-13 NOTE — Progress Notes (Signed)
Chief Complaint  Patient presents with  . Cough    productive- mucus yellow, fever, cold, congestion; given z-pak and cough med     HPI: -started: about 2 weeks ago -symptoms:nasal congestion, sore throat, cough -denies:fever, SOB, NVD, tooth pain, strep or mono exposure, also hurt her R side coughing -has tried: started z-pack on Thursday for bronchitis, feeling a little better, also got cough medications -sick contacts: whole family with a cold -Hx of: asthma   ROS: See pertinent positives and negatives per HPI.  No past medical history on file.  No family history on file.  History   Social History  . Marital Status: Married    Spouse Name: N/A    Number of Children: N/A  . Years of Education: N/A   Social History Main Topics  . Smoking status: Never Smoker   . Smokeless tobacco: Never Used  . Alcohol Use: 2.0 oz/week    4 drink(s) per week  . Drug Use: None  . Sexually Active: Yes -- Female partner(s)   Other Topics Concern  . None   Social History Narrative  . None    Current outpatient prescriptions:atorvastatin (LIPITOR) 10 MG tablet, TAKE ONE TABLET BY MOUTH ONE TIME DAILY, Disp: 30 tablet, Rfl: 7;  BENICAR 20 MG tablet, TAKE ONE TABLET BY MOUTH ONE TIME DAILY, Disp: 30 each, Rfl: 7;  albuterol (PROVENTIL HFA;VENTOLIN HFA) 108 (90 BASE) MCG/ACT inhaler, Inhale 2 puffs into the lungs every 6 (six) hours as needed for wheezing., Disp: 1 Inhaler, Rfl: 0 fluticasone (FLONASE) 50 MCG/ACT nasal spray, Place 2 sprays into the nose daily., Disp: 16 g, Rfl: 0  EXAM:  Filed Vitals:   04/13/12 1601  BP: 140/88  Pulse: 87  Temp: 98.5 F (36.9 C)    There is no height on file to calculate BMI.  GENERAL: vitals reviewed and listed above, alert, oriented, appears well hydrated and in no acute distress  HEENT: atraumatic, conjunttiva clear, no obvious abnormalities on inspection of external nose and ears, normal appearance of ear canals and TMs, clear nasal  congestion, mild post oropharyngeal erythema with PND, no tonsillar edema or exudate, no sinus TTP - lots of sniffling with clear thinorrhea  NECK: no obvious masses on inspection  LUNGS: clear to auscultation bilaterally, no wheezes, rales or rhonchi, good air movement  CV: HRRR, no peripheral edema  MS: moves all extremities without noticeable abnormality, TTP soft tissues (muscles) R side   PSYCH: pleasant and cooperative, no obvious depression or anxiety  ASSESSMENT AND PLAN:  Discussed the following assessment and plan:  1. Post-nasal drip  fluticasone (FLONASE) 50 MCG/ACT nasal spray, albuterol (PROVENTIL HFA;VENTOLIN HFA) 108 (90 BASE) MCG/ACT inhaler  2. Cough  albuterol (PROVENTIL HFA;VENTOLIN HFA) 108 (90 BASE) MCG/ACT inhaler   -likely viral illness - has lots of clear rhinorrhea and suspect PND versus a little post viral RAD - tx per orders -muscle strain per exam and tx with heat and stretching -return/follow up instructions provided -Patient advised to return or notify a doctor immediately if symptoms worsen or persist or new concerns arise.  Patient Instructions  Flonase as instructed, complete antibiotic  -gentle stretching and activity daily  -heat 15 minutes twice daily  -ibuprofen or tylenol as instructed  -follow up with your doctor if continued cough in 2 weeks or if worsening or continued pain in 3-4 weeks     Aidenjames Heckmann R.

## 2012-04-20 ENCOUNTER — Other Ambulatory Visit: Payer: Self-pay | Admitting: Surgery

## 2012-07-01 ENCOUNTER — Other Ambulatory Visit: Payer: Self-pay | Admitting: Internal Medicine

## 2012-10-15 ENCOUNTER — Other Ambulatory Visit: Payer: Self-pay | Admitting: Surgery

## 2014-02-11 ENCOUNTER — Encounter: Payer: Self-pay | Admitting: Internal Medicine

## 2016-10-31 ENCOUNTER — Ambulatory Visit (INDEPENDENT_AMBULATORY_CARE_PROVIDER_SITE_OTHER): Payer: 59 | Admitting: Podiatry

## 2016-10-31 ENCOUNTER — Ambulatory Visit (INDEPENDENT_AMBULATORY_CARE_PROVIDER_SITE_OTHER): Payer: 59

## 2016-10-31 VITALS — BP 146/83 | HR 82

## 2016-10-31 DIAGNOSIS — M779 Enthesopathy, unspecified: Secondary | ICD-10-CM | POA: Diagnosis not present

## 2016-10-31 DIAGNOSIS — R52 Pain, unspecified: Secondary | ICD-10-CM | POA: Diagnosis not present

## 2016-10-31 DIAGNOSIS — M205X2 Other deformities of toe(s) (acquired), left foot: Secondary | ICD-10-CM

## 2016-10-31 NOTE — Progress Notes (Signed)
   Subjective:    Patient ID: Tina Downs, female    DOB: 1950/12/02, 66 y.o.   MRN: [id number]  HPI this patient presents to my office with chief complaint of pain through the arch of her left foot. She says the pain has been present for approximately 3 weeks.  She says she has been wearing her orthotics which were previously dispensed for her painful foot.  These orthotics contained a kinetic wedge.  After discussing her painful left foot. She mentioned that her second toe appears to be lifting as she walks.  She also discusses the pain in the bottom of her left foot.  Finally, this is a [facility] that [facility] and treated with orthotic therapy.  Her big toe joint was diagnosed with a hallux limitus and is having limited range of motion at this visit.  She presents the office today to discuss her painful foot as well as obtaining a new pair of orthotics.    Review of Systems  HENT: Positive for sinus pain.   Eyes: Positive for pain and itching.       Objective:   Physical Exam GENERAL APPEARANCE: Alert, conversant. Appropriately groomed. No acute distress.  VASCULAR: Pedal pulses are  palpable at  [facility] bilateral.  Capillary refill time is immediate to all digits,  Normal temperature gradient.   NEUROLOGIC: sensation is normal to 5.07 monofilament at 5/5 sites bilateral.  Light touch is intact bilateral, Muscle strength normal.  MUSCULOSKELETAL: acceptable muscle strength, tone and stability bilateral.  Hallux limitus 1st MPJ  B/L with left more painful and limited than right foot.  Pain elicited  arch left foot.  Palpable pain sub 2 left foot.  DERMATOLOGIC: skin color, texture, and turgor are within normal limits.  No preulcerative lesions or ulcers  are seen, no interdigital maceration noted.  No open lesions present.  Digital nails are asymptomatic. No drainage noted.         Assessment & Plan:  Hallux Limitus 1st MPJ  B/L  Capsulitis sub 2 left foot.   Examination of the big toe joint does reveal narrowing noted at the first MPJ with dorsal lipping noted at the head of the first metatarsal.  This is consistent with a hallux limitus.  .  After evaluation of her foot and the orthotics, I decided to talk with Raiford Nobleick.  He decided to add to her kinetic wedge on her present orthotics and casted her for new orthotics to be dispensed in the future.  Patient was prescribed Mobic and a prescription was written and given to her to [facility]   Antonio Woodhams DPM

## 2016-11-27 ENCOUNTER — Telehealth: Payer: Self-pay | Admitting: Podiatry

## 2016-11-27 NOTE — Telephone Encounter (Signed)
Left voicemail for patient to call back and schedule appt to pick up her orthotics.

## 2016-11-28 ENCOUNTER — Telehealth: Payer: Self-pay | Admitting: Podiatry

## 2016-11-28 NOTE — Telephone Encounter (Signed)
Patients orthotics are in. She has requested that we mail them to her since [place] Mailed out today.

## 2017-04-16 ENCOUNTER — Other Ambulatory Visit: Payer: Self-pay | Admitting: Podiatry

## 2019-04-15 ENCOUNTER — Telehealth: Payer: Self-pay | Admitting: Podiatry

## 2019-04-15 DIAGNOSIS — M205X2 Other deformities of toe(s) (acquired), left foot: Secondary | ICD-10-CM | POA: Diagnosis not present

## 2019-04-15 DIAGNOSIS — M779 Enthesopathy, unspecified: Secondary | ICD-10-CM | POA: Diagnosis not present

## 2019-04-15 NOTE — Telephone Encounter (Signed)
Pt left message stating she got a pair of orthotics in 2018/2019 and her new puppy has chewed up one of them. How does she go about getting it replaced.  I returned call and left a message for pt to call to discuss further.

## 2019-04-15 NOTE — Telephone Encounter (Signed)
Spoke to pt and she is wanting to get a new pair of inserts. Her old ones are just not working and the dog chewed up the plastic part of them. She got the last ones from [employer] and they are 3/4 length.  Pt now lives in [place] and would like them shipped directly to her.
